# Patient Record
Sex: Male | Born: 1960 | Race: White | Hispanic: No | Marital: Married | Smoking: Current every day smoker
Health system: Southern US, Community
[De-identification: ages and names within clinical notes are randomized; demographics above are authoritative.]

## PROBLEM LIST (undated history)

## (undated) DIAGNOSIS — S92909A Unspecified fracture of unspecified foot, initial encounter for closed fracture: Secondary | ICD-10-CM

## (undated) DIAGNOSIS — C801 Malignant (primary) neoplasm, unspecified: Secondary | ICD-10-CM

## (undated) DIAGNOSIS — Z0000 Encounter for general adult medical examination without abnormal findings: Secondary | ICD-10-CM

## (undated) DIAGNOSIS — E871 Hypo-osmolality and hyponatremia: Secondary | ICD-10-CM

## (undated) DIAGNOSIS — M549 Dorsalgia, unspecified: Secondary | ICD-10-CM

## (undated) DIAGNOSIS — E782 Mixed hyperlipidemia: Secondary | ICD-10-CM

## (undated) DIAGNOSIS — R945 Abnormal results of liver function studies: Secondary | ICD-10-CM

## (undated) HISTORY — DX: Hypo-osmolality and hyponatremia: E87.1

## (undated) HISTORY — DX: Dorsalgia, unspecified: M54.9

## (undated) HISTORY — PX: SKIN CANCER EXCISION: SHX779

## (undated) HISTORY — PX: HERNIA REPAIR: SHX51

## (undated) HISTORY — DX: Unspecified fracture of unspecified foot, initial encounter for closed fracture: S92.909A

## (undated) HISTORY — DX: Mixed hyperlipidemia: E78.2

## (undated) HISTORY — DX: Abnormal results of liver function studies: R94.5

## (undated) HISTORY — DX: Encounter for general adult medical examination without abnormal findings: Z00.00

## (undated) HISTORY — DX: Malignant (primary) neoplasm, unspecified: C80.1

---

## 2014-11-21 ENCOUNTER — Telehealth: Payer: Self-pay

## 2014-11-21 NOTE — Telephone Encounter (Signed)
New patient Scheduled to see Elyn Aquas, PA-C on 11/23/14 @ 1:30 pm.   Left a message for call back.

## 2014-11-23 ENCOUNTER — Ambulatory Visit: Payer: Self-pay | Admitting: Family Medicine

## 2014-12-07 NOTE — Telephone Encounter (Signed)
Pt no showed for appointment

## 2015-02-09 ENCOUNTER — Encounter: Payer: Self-pay | Admitting: *Deleted

## 2015-02-09 ENCOUNTER — Telehealth: Payer: Self-pay | Admitting: *Deleted

## 2015-02-09 NOTE — Telephone Encounter (Signed)
Pre-Visit Call completed with patient and chart updated.   Pre-Visit Info documented in Specialty Comments under [employer].    

## 2015-02-12 ENCOUNTER — Ambulatory Visit (HOSPITAL_BASED_OUTPATIENT_CLINIC_OR_DEPARTMENT_OTHER)
Admission: RE | Admit: 2015-02-12 | Discharge: 2015-02-12 | Disposition: A | Discharge status: Home / Self Care | Payer: 59 | Source: Ambulatory Visit | Attending: Family Medicine | Admitting: Family Medicine

## 2015-02-12 ENCOUNTER — Ambulatory Visit (INDEPENDENT_AMBULATORY_CARE_PROVIDER_SITE_OTHER): Payer: 59 | Admitting: Family Medicine

## 2015-02-12 ENCOUNTER — Encounter: Payer: Self-pay | Admitting: Family Medicine

## 2015-02-12 VITALS — BP 125/82 | HR 99 | Temp 97.8°F | Ht 71.0 in | Wt 226.1 lb

## 2015-02-12 DIAGNOSIS — E782 Mixed hyperlipidemia: Secondary | ICD-10-CM

## 2015-02-12 DIAGNOSIS — M545 Low back pain: Secondary | ICD-10-CM

## 2015-02-12 DIAGNOSIS — R945 Abnormal results of liver function studies: Secondary | ICD-10-CM

## 2015-02-12 DIAGNOSIS — M479 Spondylosis, unspecified: Secondary | ICD-10-CM | POA: Diagnosis not present

## 2015-02-12 DIAGNOSIS — Z0000 Encounter for general adult medical examination without abnormal findings: Secondary | ICD-10-CM

## 2015-02-12 DIAGNOSIS — E871 Hypo-osmolality and hyponatremia: Secondary | ICD-10-CM

## 2015-02-12 DIAGNOSIS — K7689 Other specified diseases of liver: Secondary | ICD-10-CM

## 2015-02-12 DIAGNOSIS — F418 Other specified anxiety disorders: Secondary | ICD-10-CM

## 2015-02-12 LAB — CBC
HEMATOCRIT: 49.1 % (ref 39.0–52.0)
Hemoglobin: 16.6 g/dL (ref 13.0–17.0)
MCHC: 33.9 g/dL (ref 30.0–36.0)
MCV: 89.3 fl (ref 78.0–100.0)
Platelets: 249 10*3/uL (ref 150.0–400.0)
RBC: 5.5 Mil/uL (ref 4.22–5.81)
RDW: 13.3 % (ref 11.5–15.5)
WBC: 14 10*3/uL — ABNORMAL HIGH (ref 4.0–10.5)

## 2015-02-12 LAB — COMPREHENSIVE METABOLIC PANEL
ALT: 106 U/L — AB (ref 0–53)
AST: 62 U/L — ABNORMAL HIGH (ref 0–37)
Albumin: 4.5 g/dL (ref 3.5–5.2)
Alkaline Phosphatase: 77 U/L (ref 39–117)
BILIRUBIN TOTAL: 0.6 mg/dL (ref 0.2–1.2)
BUN: 16 mg/dL (ref 6–23)
CHLORIDE: 101 meq/L (ref 96–112)
CO2: 25 meq/L (ref 19–32)
Calcium: 9.8 mg/dL (ref 8.4–10.5)
Creatinine, Ser: 0.84 mg/dL (ref 0.40–1.50)
GFR: 101.35 mL/min (ref >60.00)
Glucose, Bld: 101 mg/dL — ABNORMAL HIGH (ref 70–99)
POTASSIUM: 4.2 meq/L (ref 3.5–5.1)
Sodium: 134 mEq/L — ABNORMAL LOW (ref 135–145)
Total Protein: 8.1 g/dL (ref 6.0–8.3)

## 2015-02-12 LAB — LIPID PANEL
Cholesterol: 212 mg/dL — ABNORMAL HIGH (ref 0–200)
HDL: 32.3 mg/dL — ABNORMAL LOW (ref >39.00)
LDL CALC: 140 mg/dL — AB (ref 0–99)
NONHDL: 179.7
TRIGLYCERIDES: 197 mg/dL — AB (ref 0.0–149.0)
Total CHOL/HDL Ratio: 7
VLDL: 39.4 mg/dL (ref 0.0–40.0)

## 2015-02-12 LAB — TSH: TSH: 4.03 u[IU]/mL (ref 0.35–4.50)

## 2015-02-12 MED ORDER — HYDROCODONE-ACETAMINOPHEN 10-325 MG PO TABS: 1.0000 | ORAL_TABLET | Freq: Every day | ORAL | Status: DC | PRN

## 2015-02-12 MED ORDER — ESCITALOPRAM OXALATE 10 MG PO TABS: 10.0000 mg | ORAL_TABLET | Freq: Every day | ORAL | Status: DC

## 2015-02-12 MED ORDER — ALPRAZOLAM 0.25 MG PO TABS: 0.2500 mg | ORAL_TABLET | Freq: Two times a day (BID) | ORAL | Status: DC | PRN

## 2015-02-12 MED ORDER — METHOCARBAMOL 500 MG PO TABS: 500.0000 mg | ORAL_TABLET | Freq: Two times a day (BID) | ORAL | Status: DC

## 2015-02-12 MED ORDER — PREDNISONE 20 MG PO TABS: 20.0000 mg | ORAL_TABLET | Freq: Two times a day (BID) | ORAL | Status: DC

## 2015-02-12 NOTE — Patient Instructions (Addendum)
[facility], Dr Cherly Beach? Previous PMD, off of [place] [facility] [place] [facility]  [facility], Dr Gershon Mussel, podiatry on [address] in [place] Encouraged moist heat and gentle stretching as tolerated. May try [place] patches twice daily and prescription meds as directed and report if symptoms worsen or seek immediate care   Preventive Care for Adults A healthy lifestyle and preventive care can promote health and wellness. Preventive health guidelines for men include the following key practices:  A routine yearly physical is a good way to check with your health care provider about your health and preventative screening. It is a chance to share any concerns and updates on your health and to receive a thorough exam.  Visit your dentist for a routine exam and preventative care every 6 months. Brush your teeth twice a day and floss once a day. Good oral hygiene prevents tooth decay and gum disease.  The frequency of eye exams is based on your age, health, family medical history, use of contact lenses, and other factors. Follow your health care provider's recommendations for frequency of eye exams.  Eat a healthy diet. Foods such as vegetables, fruits, whole grains, low-fat dairy products, and lean protein foods contain the nutrients you need without too many calories. Decrease your intake of foods high in solid fats, added sugars, and salt. Eat the right amount of calories for you.Get information about a proper diet from your health care provider, if necessary.  Regular physical exercise is one of the most important things you can do for your health. Most adults should get at least 150 minutes of moderate-intensity exercise (any activity that increases your heart rate and causes you to sweat) each week. In addition, most adults need muscle-strengthening exercises on 2 or more days a week.  Maintain a healthy weight. The body mass index (BMI) is a screening tool to identify possible weight  problems. It provides an estimate of body fat based on height and weight. Your health care provider can find your BMI and can help you achieve or maintain a healthy weight.For adults 20 years and older:  A BMI below 18.5 is considered underweight.  A BMI of 18.5 to 24.9 is normal.  A BMI of 25 to 29.9 is considered overweight.  A BMI of 30 and above is considered obese.  Maintain normal blood lipids and cholesterol levels by exercising and minimizing your intake of saturated fat. Eat a balanced diet with plenty of fruit and vegetables. Blood tests for lipids and cholesterol should begin at age 34 and be repeated every 5 years. If your lipid or cholesterol levels are high, you are over 50, or you are at high risk for heart disease, you may need your cholesterol levels checked more frequently.Ongoing high lipid and cholesterol levels should be treated with medicines if diet and exercise are not working.  If you smoke, find out from your health care provider how to quit. If you do not use tobacco, do not start.  Lung cancer screening is recommended for adults aged 32-80 years who are at high risk for developing lung cancer because of a history of smoking. A yearly low-dose CT scan of the lungs is recommended for people who have at least a 30-pack-year history of smoking and are a current smoker or have quit within the past 15 years. A pack year of smoking is smoking an average of 1 pack of cigarettes a day for 1 year (for example: 1 pack a day  for 30 years or 2 packs a day for 15 years). Yearly screening should continue until the smoker has stopped smoking for at least 15 years. Yearly screening should be stopped for people who develop a health problem that would prevent them from having lung cancer treatment.  If you choose to drink alcohol, do not have more than 2 drinks per day. One drink is considered to be 12 ounces (355 mL) of beer, 5 ounces (148 mL) of wine, or 1.5 ounces (44 mL) of  liquor.  Avoid use of street drugs. Do not share needles with anyone. Ask for help if you need support or instructions about stopping the use of drugs.  High blood pressure causes heart disease and increases the risk of stroke. Your blood pressure should be checked at least every 1-2 years. Ongoing high blood pressure should be treated with medicines, if weight loss and exercise are not effective.  If you are 68-33 years old, ask your health care provider if you should take aspirin to prevent heart disease.  Diabetes screening involves taking a blood sample to check your fasting blood sugar level. This should be done once every 3 years, after age 23, if you are within normal weight and without risk factors for diabetes. Testing should be considered at a younger age or be carried out more frequently if you are overweight and have at least 1 risk factor for diabetes.  Colorectal cancer can be detected and often prevented. Most routine colorectal cancer screening begins at the age of 51 and continues through age 13. However, your health care provider may recommend screening at an earlier age if you have risk factors for colon cancer. On a yearly basis, your health care provider may provide home test kits to check for hidden blood in the stool. Use of a small camera at the end of a tube to directly examine the colon (sigmoidoscopy or colonoscopy) can detect the earliest forms of colorectal cancer. Talk to your health care provider about this at age 77, when routine screening begins. Direct exam of the colon should be repeated every 5-10 years through age 45, unless early forms of precancerous polyps or small growths are found.  People who are at an increased risk for hepatitis B should be screened for this virus. You are considered at high risk for hepatitis B if:  You were born in a country where hepatitis B occurs often. Talk with your health care provider about which countries are considered high  risk.  Your parents were born in a high-risk country and you have not received a shot to protect against hepatitis B (hepatitis B vaccine).  You have HIV or AIDS.  You use needles to inject street drugs.  You live with, or have sex with, someone who has hepatitis B.  You are a man who has sex with other men (MSM).  You get hemodialysis treatment.  You take certain medicines for conditions such as cancer, organ transplantation, and autoimmune conditions.  Hepatitis C blood testing is recommended for all people born from [birth year] through 1965 and any individual with known risks for hepatitis C.  Practice safe sex. Use condoms and avoid high-risk sexual practices to reduce the spread of sexually transmitted infections (STIs). STIs include gonorrhea, chlamydia, syphilis, trichomonas, herpes, HPV, and human immunodeficiency virus (HIV). Herpes, HIV, and HPV are viral illnesses that have no cure. They can result in disability, cancer, and death.  If you are at risk of being infected with HIV, it  is recommended that you take a prescription medicine daily to prevent HIV infection. This is called preexposure prophylaxis (PrEP). You are considered at risk if:  You are a man who has sex with other men (MSM) and have other risk factors.  You are a heterosexual man, are sexually active, and are at increased risk for HIV infection.  You take drugs by injection.  You are sexually active with a partner who has HIV.  Talk with your health care provider about whether you are at high risk of being infected with HIV. If you choose to begin PrEP, you should first be tested for HIV. You should then be tested every 3 months for as long as you are taking PrEP.  A one-time screening for abdominal aortic aneurysm (AAA) and surgical repair of large AAAs by ultrasound are recommended for men ages 65 to 70 years who are current or former smokers.  Healthy men should no longer receive prostate-specific antigen (PSA)  blood tests as part of routine cancer screening. Talk with your health care provider about prostate cancer screening.  Testicular cancer screening is not recommended for adult males who have no symptoms. Screening includes self-exam, a health care provider exam, and other screening tests. Consult with your health care provider about any symptoms you have or any concerns you have about testicular cancer.  Use sunscreen. Apply sunscreen liberally and repeatedly throughout the day. You should seek shade when your shadow is shorter than you. Protect yourself by wearing long sleeves, pants, a wide-brimmed hat, and sunglasses year round, whenever you are outdoors.  Once a month, do a whole-body skin exam, using a mirror to look at the skin on your back. Tell your health care provider about new moles, moles that have irregular borders, moles that are larger than a pencil eraser, or moles that have changed in shape or color.  Stay current with required vaccines (immunizations).  Influenza vaccine. All adults should be immunized every year.  Tetanus, diphtheria, and acellular pertussis (Td, Tdap) vaccine. An adult who has not previously received Tdap or who does not know his vaccine status should receive 1 dose of Tdap. This initial dose should be followed by tetanus and diphtheria toxoids (Td) booster doses every 10 years. Adults with an unknown or incomplete history of completing a 3-dose immunization series with Td-containing vaccines should begin or complete a primary immunization series including a Tdap dose. Adults should receive a Td booster every 10 years.  Varicella vaccine. An adult without evidence of immunity to varicella should receive 2 doses or a second dose if he has previously received 1 dose.  Human papillomavirus (HPV) vaccine. Males aged 55-21 years who have not received the vaccine previously should receive the 3-dose series. Males aged 22-26 years may be immunized. Immunization is  recommended through the age of 42 years for any male who has sex with males and did not get any or all doses earlier. Immunization is recommended for any person with an immunocompromised condition through the age of 68 years if he did not get any or all doses earlier. During the 3-dose series, the second dose should be obtained 4-8 weeks after the first dose. The third dose should be obtained 24 weeks after the first dose and 16 weeks after the second dose.  Zoster vaccine. One dose is recommended for adults aged 24 years or older unless certain conditions are present.  Measles, mumps, and rubella (MMR) vaccine. Adults born before [birth year] generally are considered immune to measles  and mumps. Adults born in [birth year] or later should have 1 or more doses of MMR vaccine unless there is a contraindication to the vaccine or there is laboratory evidence of immunity to each of the three diseases. A routine second dose of MMR vaccine should be obtained at least 28 days after the first dose for students attending postsecondary schools, health care workers, or international travelers. People who received inactivated measles vaccine or an unknown type of measles vaccine during 1963-1967 should receive 2 doses of MMR vaccine. People who received inactivated mumps vaccine or an unknown type of mumps vaccine before 1979 and are at high risk for mumps infection should consider immunization with 2 doses of MMR vaccine. Unvaccinated health care workers born before [birth year] who lack laboratory evidence of measles, mumps, or rubella immunity or laboratory confirmation of disease should consider measles and mumps immunization with 2 doses of MMR vaccine or rubella immunization with 1 dose of MMR vaccine.  Pneumococcal 13-valent conjugate (PCV13) vaccine. When indicated, a person who is uncertain of his immunization history and has no record of immunization should receive the PCV13 vaccine. An adult aged 79 years or older who has certain  medical conditions and has not been previously immunized should receive 1 dose of PCV13 vaccine. This PCV13 should be followed with a dose of pneumococcal polysaccharide (PPSV23) vaccine. The PPSV23 vaccine dose should be obtained at least 8 weeks after the dose of PCV13 vaccine. An adult aged 62 years or older who has certain medical conditions and previously received 1 or more doses of PPSV23 vaccine should receive 1 dose of PCV13. The PCV13 vaccine dose should be obtained 1 or more years after the last PPSV23 vaccine dose.  Pneumococcal polysaccharide (PPSV23) vaccine. When PCV13 is also indicated, PCV13 should be obtained first. All adults aged 74 years and older should be immunized. An adult younger than age 24 years who has certain medical conditions should be immunized. Any person who resides in a nursing home or long-term care facility should be immunized. An adult smoker should be immunized. People with an immunocompromised condition and certain other conditions should receive both PCV13 and PPSV23 vaccines. People with human immunodeficiency virus (HIV) infection should be immunized as soon as possible after diagnosis. Immunization during chemotherapy or radiation therapy should be avoided. Routine use of PPSV23 vaccine is not recommended for American Indians, [place] Natives, or people younger than 65 years unless there are medical conditions that require PPSV23 vaccine. When indicated, people who have unknown immunization and have no record of immunization should receive PPSV23 vaccine. One-time revaccination 5 years after the first dose of PPSV23 is recommended for people aged 19-64 years who have chronic kidney failure, nephrotic syndrome, asplenia, or immunocompromised conditions. People who received 1-2 doses of PPSV23 before age 66 years should receive another dose of PPSV23 vaccine at age 9 years or later if at least 5 years have passed since the previous dose. Doses of PPSV23 are not needed for  people immunized with PPSV23 at or after age 66 years.  Meningococcal vaccine. Adults with asplenia or persistent complement component deficiencies should receive 2 doses of quadrivalent meningococcal conjugate (MenACWY-D) vaccine. The doses should be obtained at least 2 months apart. Microbiologists working with certain meningococcal bacteria, [place] recruits, people at risk during an outbreak, and people who travel to or live in countries with a high rate of meningitis should be immunized. A first-year college student up through age 38 years who is living in a residence hall  should receive a dose if he did not receive a dose on or after his 16th birthday. Adults who have certain high-risk conditions should receive one or more doses of vaccine.  Hepatitis A vaccine. Adults who wish to be protected from this disease, have certain high-risk conditions, work with hepatitis A-infected animals, work in hepatitis A research labs, or travel to or work in countries with a high rate of hepatitis A should be immunized. Adults who were previously unvaccinated and who anticipate close contact with an international adoptee during the first 60 days after arrival in the [place] from a country with a high rate of hepatitis A should be immunized.  Hepatitis B vaccine. Adults should be immunized if they wish to be protected from this disease, have certain high-risk conditions, may be exposed to blood or other infectious body fluids, are household contacts or sex partners of hepatitis B positive people, are clients or workers in certain care facilities, or travel to or work in countries with a high rate of hepatitis B.  Haemophilus influenzae type b (Hib) vaccine. A previously unvaccinated person with asplenia or sickle cell disease or having a scheduled splenectomy should receive 1 dose of Hib vaccine. Regardless of previous immunization, a recipient of a hematopoietic stem cell transplant should receive a 3-dose  series 6-12 months after his successful transplant. Hib vaccine is not recommended for adults with HIV infection. Preventive Service / Frequency Ages 81 to 87  Blood pressure check.** / Every 1 to 2 years.  Lipid and cholesterol check.** / Every 5 years beginning at age 73.  Hepatitis C blood test.** / For any individual with known risks for hepatitis C.  Skin self-exam. / Monthly.  Influenza vaccine. / Every year.  Tetanus, diphtheria, and acellular pertussis (Tdap, Td) vaccine.** / Consult your health care provider. 1 dose of Td every 10 years.  Varicella vaccine.** / Consult your health care provider.  HPV vaccine. / 3 doses over 6 months, if 67 or younger.  Measles, mumps, rubella (MMR) vaccine.** / You need at least 1 dose of MMR if you were born in 1957 or later. You may also need a second dose.  Pneumococcal 13-valent conjugate (PCV13) vaccine.** / Consult your health care provider.  Pneumococcal polysaccharide (PPSV23) vaccine.** / 1 to 2 doses if you smoke cigarettes or if you have certain conditions.  Meningococcal vaccine.** / 1 dose if you are age 7 to 105 years and a Market researcher living in a residence hall, or have one of several medical conditions. You may also need additional booster doses.  Hepatitis A vaccine.** / Consult your health care provider.  Hepatitis B vaccine.** / Consult your health care provider.  Haemophilus influenzae type b (Hib) vaccine.** / Consult your health care provider. Ages 25 to 44  Blood pressure check.** / Every 1 to 2 years.  Lipid and cholesterol check.** / Every 5 years beginning at age 74.  Lung cancer screening. / Every year if you are aged 37-80 years and have a 30-pack-year history of smoking and currently smoke or have quit within the past 15 years. Yearly screening is stopped once you have quit smoking for at least 15 years or develop a health problem that would prevent you from having lung cancer  treatment.  Fecal occult blood test (FOBT) of stool. / Every year beginning at age 71 and continuing until age 65. You may not have to do this test if you get a colonoscopy every 10 years.  Flexible  sigmoidoscopy** or colonoscopy.** / Every 5 years for a flexible sigmoidoscopy or every 10 years for a colonoscopy beginning at age 64 and continuing until age 62.  Hepatitis C blood test.** / For all people born from [birth year] through 1965 and any individual with known risks for hepatitis C.  Skin self-exam. / Monthly.  Influenza vaccine. / Every year.  Tetanus, diphtheria, and acellular pertussis (Tdap/Td) vaccine.** / Consult your health care provider. 1 dose of Td every 10 years.  Varicella vaccine.** / Consult your health care provider.  Zoster vaccine.** / 1 dose for adults aged 55 years or older.  Measles, mumps, rubella (MMR) vaccine.** / You need at least 1 dose of MMR if you were born in 1957 or later. You may also need a second dose.  Pneumococcal 13-valent conjugate (PCV13) vaccine.** / Consult your health care provider.  Pneumococcal polysaccharide (PPSV23) vaccine.** / 1 to 2 doses if you smoke cigarettes or if you have certain conditions.  Meningococcal vaccine.** / Consult your health care provider.  Hepatitis A vaccine.** / Consult your health care provider.  Hepatitis B vaccine.** / Consult your health care provider.  Haemophilus influenzae type b (Hib) vaccine.** / Consult your health care provider. Ages 73 and over  Blood pressure check.** / Every 1 to 2 years.  Lipid and cholesterol check.**/ Every 5 years beginning at age 43.  Lung cancer screening. / Every year if you are aged 97-80 years and have a 30-pack-year history of smoking and currently smoke or have quit within the past 15 years. Yearly screening is stopped once you have quit smoking for at least 15 years or develop a health problem that would prevent you from having lung cancer treatment.  Fecal occult  blood test (FOBT) of stool. / Every year beginning at age 56 and continuing until age 68. You may not have to do this test if you get a colonoscopy every 10 years.  Flexible sigmoidoscopy** or colonoscopy.** / Every 5 years for a flexible sigmoidoscopy or every 10 years for a colonoscopy beginning at age 46 and continuing until age 53.  Hepatitis C blood test.** / For all people born from [birth year] through 1965 and any individual with known risks for hepatitis C.  Abdominal aortic aneurysm (AAA) screening.** / A one-time screening for ages 62 to 97 years who are current or former smokers.  Skin self-exam. / Monthly.  Influenza vaccine. / Every year.  Tetanus, diphtheria, and acellular pertussis (Tdap/Td) vaccine.** / 1 dose of Td every 10 years.  Varicella vaccine.** / Consult your health care provider.  Zoster vaccine.** / 1 dose for adults aged 41 years or older.  Pneumococcal 13-valent conjugate (PCV13) vaccine.** / Consult your health care provider.  Pneumococcal polysaccharide (PPSV23) vaccine.** / 1 dose for all adults aged 8 years and older.  Meningococcal vaccine.** / Consult your health care provider.  Hepatitis A vaccine.** / Consult your health care provider.  Hepatitis B vaccine.** / Consult your health care provider.  Haemophilus influenzae type b (Hib) vaccine.** / Consult your health care provider. **Family history and personal history of risk and conditions may change your health care provider's recommendations. Document Released: 11/04/2001 Document Revised: 09/13/2013 Document Reviewed: 02/03/2011 [facility] Patient Information 2015 [place], [place]. This information is not intended to replace advice given to you by your health care provider. Make sure you discuss any questions you have with your health care provider. Nicotine Addiction Nicotine can act as both a stimulant (excites/activates) and a sedative (calms/quiets). Immediately after exposure  to nicotine, there is a  "kick" caused in part by the drug's stimulation of the adrenal glands and resulting discharge of adrenaline (epinephrine). The rush of adrenaline stimulates the body and causes a sudden release of sugar. This means that smokers are always slightly hyperglycemic. Hyperglycemic means that the blood sugar is high, just like in diabetics. Nicotine also decreases the amount of insulin which helps control sugar levels in the body. There is an increase in blood pressure, breathing, and the rate of heart beats.  In addition, nicotine indirectly causes a release of dopamine in the brain that controls pleasure and motivation. A similar reaction is seen with other drugs of abuse, such as cocaine and heroin. This dopamine release is thought to cause the pleasurable sensations when smoking. In some different cases, nicotine can also create a calming effect, depending on sensitivity of the smoker's nervous system and the dose of nicotine taken. WHAT HAPPENS WHEN NICOTINE IS TAKEN FOR LONG PERIODS OF TIME?  Long-term use of nicotine results in addiction. It is difficult to stop.  Repeated use of nicotine creates tolerance. Higher doses of nicotine are needed to get the "kick." When nicotine use is stopped, withdrawal may last a month or more. Withdrawal may begin within a few hours after the last cigarette. Symptoms peak within the first few days and may lessen within a few weeks. For some people, however, symptoms may last for months or longer. Withdrawal symptoms include:   Irritability.  Craving.  Learning and attention deficits.  Sleep disturbances.  Increased appetite. Craving for tobacco may last for 6 months or longer. Many behaviors done while using nicotine can also play a part in the severity of withdrawal symptoms. For some people, the feel, smell, and sight of a cigarette and the ritual of obtaining, handling, lighting, and smoking the cigarette are closely linked with the pleasure of smoking. When  stopped, they also miss the related behaviors which make the withdrawal or craving worse. While nicotine gum and patches may lessen the drug aspects of withdrawal, cravings often persist. WHAT ARE THE MEDICAL CONSEQUENCES OF NICOTINE USE?  Nicotine addiction accounts for one-third of all cancers. The top cancer caused by tobacco is lung cancer. Lung cancer is the number one cancer killer of both men and women.  Smoking is also associated with cancers of the:  Mouth.  Pharynx.  Larynx.  Esophagus.  Stomach.  Pancreas.  Cervix.  Kidney.  Ureter.  Bladder.  Smoking also causes lung diseases such as lasting (chronic) bronchitis and emphysema.  It worsens asthma in adults and children.  Smoking increases the risk of heart disease, including:  Stroke.  Heart attack.  Vascular disease.  Aneurysm.  Passive or secondary smoke can also increase medical risks including:  Asthma in children.  Sudden Infant Death Syndrome (SIDS).  Additionally, dropped cigarettes are the leading cause of residential fire fatalities.  Nicotine poisoning has been reported from accidental ingestion of tobacco products by children and pets. Death usually results in a few minutes from respiratory failure (when a person stops breathing) caused by paralysis. TREATMENT   Medication. Nicotine replacement medicines such as nicotine gum and the patch are used to stop smoking. These medicines gradually lower the dosage of nicotine in the body. These medicines do not contain the carbon monoxide and other toxins found in tobacco smoke.  Hypnotherapy.  Relaxation therapy.  Nicotine Anonymous (a 12-step support program). Find times and locations in your local yellow pages. Document Released: 05/14/2004 Document Revised: 12/01/2011  Document Reviewed: 11/04/2013 [facility] Patient Information 2015 [place], [place]. This information is not intended to replace advice given to you by your health care provider.  Make sure you discuss any questions you have with your health care provider.

## 2015-02-12 NOTE — Progress Notes (Signed)
Pre visit review using our clinic review tool, if applicable. No additional management support is needed unless otherwise documented below in the visit note. 

## 2015-02-13 ENCOUNTER — Other Ambulatory Visit: Payer: Self-pay | Admitting: Family Medicine

## 2015-02-13 ENCOUNTER — Telehealth: Payer: Self-pay | Admitting: Family Medicine

## 2015-02-13 DIAGNOSIS — D72829 Elevated white blood cell count, unspecified: Secondary | ICD-10-CM

## 2015-02-13 DIAGNOSIS — R7989 Other specified abnormal findings of blood chemistry: Secondary | ICD-10-CM

## 2015-02-13 DIAGNOSIS — R945 Abnormal results of liver function studies: Secondary | ICD-10-CM

## 2015-02-13 NOTE — Telephone Encounter (Signed)
Labs entered for repeat on March 02, 2015

## 2015-02-13 NOTE — Telephone Encounter (Signed)
Patients wife informed mailed a copy of labs to home

## 2015-02-13 NOTE — Telephone Encounter (Signed)
Caller name: stacey Relation to pt: wife Call back number: [phone] Pharmacy:  Reason for call:   Requesting liver results from this past time and last year. Wanted to compare.

## 2015-02-19 ENCOUNTER — Encounter: Payer: Self-pay | Admitting: Family Medicine

## 2015-02-19 DIAGNOSIS — F418 Other specified anxiety disorders: Secondary | ICD-10-CM | POA: Insufficient documentation

## 2015-02-19 DIAGNOSIS — E871 Hypo-osmolality and hyponatremia: Secondary | ICD-10-CM

## 2015-02-19 DIAGNOSIS — Z0000 Encounter for general adult medical examination without abnormal findings: Secondary | ICD-10-CM | POA: Insufficient documentation

## 2015-02-19 DIAGNOSIS — R945 Abnormal results of liver function studies: Secondary | ICD-10-CM

## 2015-02-19 DIAGNOSIS — E782 Mixed hyperlipidemia: Secondary | ICD-10-CM

## 2015-02-19 DIAGNOSIS — M549 Dorsalgia, unspecified: Secondary | ICD-10-CM

## 2015-02-19 HISTORY — DX: Abnormal results of liver function studies: R94.5

## 2015-02-19 HISTORY — DX: Encounter for general adult medical examination without abnormal findings: Z00.00

## 2015-02-19 HISTORY — DX: Mixed hyperlipidemia: E78.2

## 2015-02-19 HISTORY — DX: Dorsalgia, unspecified: M54.9

## 2015-02-19 HISTORY — DX: Hypo-osmolality and hyponatremia: E87.1

## 2015-02-19 NOTE — Assessment & Plan Note (Signed)
Referred for screening colonoscopy 

## 2015-02-19 NOTE — Assessment & Plan Note (Signed)
Started on Lexapro, Alprazolam prn.

## 2015-02-19 NOTE — Assessment & Plan Note (Signed)
Encouraged heart healthy diet, increase exercise, avoid trans fats, consider a krill oil cap daily 

## 2015-02-19 NOTE — Progress Notes (Signed)
Jesus Clark  [id number] 11/07/1960 02/19/2015      Progress Note-Follow Up  Subjective  Chief Complaint  Chief Complaint  Patient presents with  . Establish Care    HPI  Patient is a 54 y.o. male in today for routine medical care. Patient is in today for new patient appointment. His greatest complaint is of back pain and stress. He is having increased pain in his low back today but has been struggling for quite some time. Says the pain is bad enough to limit his activity and he is interested in having it looked at. He also notes increases in stress and thus depression and anxiety this year largely job-related. No other recent illness or acute complaints. Denies CP/palp/SOB/HA/congestion/fevers/GI or GU c/o. Taking meds as prescribed  Past Medical History  Diagnosis Date  . Cancer     skin  . Foot fracture right  . Back pain 02/19/2015  . Preventative health care 02/19/2015  . Hyperlipidemia, mixed 02/19/2015  . Abnormal liver function 02/19/2015  . Hyponatremia 02/19/2015    Past Surgical History  Procedure Laterality Date  . Skin cancer excision Left   . Hernia repair      umbilical    Family History  Problem Relation Age of Onset  . Cancer Mother 46    lung  . Myasthenia gravis Mother 22  . Rheum arthritis Sister   . Osteoporosis Sister   . Heart disease Paternal Grandfather     History   Social History  . Marital Status: Married    Spouse Name: N/A  . Number of Children: N/A  . Years of Education: N/A   Occupational History  . Engineer    Social History Main Topics  . Smoking status: Current Every Day Smoker -- 1.00 packs/day    Types: Cigarettes  . Smokeless tobacco: Not on file  . Alcohol Use: No  . Drug Use: No  . Sexual Activity: Yes     Comment: lives with wife. no dietary restrictions.  off work due to broken fot   Other Topics Concern  . Not on file   Social History Narrative    Current Outpatient Prescriptions on File Prior to Visit   Medication Sig Dispense Refill  . multivitamin-iron-minerals-folic acid (CENTRUM) chewable tablet Chew 1 tablet by mouth daily.     No current facility-administered medications on file prior to visit.    Allergies  Allergen Reactions  . Codeine Itching    Caused migraine    Review of Systems  Review of Systems  Constitutional: Negative for fever, chills and malaise/fatigue.  HENT: Negative for congestion, hearing loss and nosebleeds.   Eyes: Negative for discharge.  Respiratory: Negative for cough, sputum production, shortness of breath and wheezing.   Cardiovascular: Negative for chest pain, palpitations and leg swelling.  Gastrointestinal: Negative for heartburn, nausea, vomiting, abdominal pain, diarrhea, constipation and blood in stool.  Genitourinary: Negative for dysuria, urgency, frequency and hematuria.  Musculoskeletal: Negative for myalgias and falls.  Skin: Negative for rash.  Neurological: Negative for dizziness, tremors, sensory change, focal weakness, loss of consciousness, weakness and headaches.  Endo/Heme/Allergies: Negative for polydipsia. Does not bruise/bleed easily.  Psychiatric/Behavioral: Positive for depression. Negative for suicidal ideas. The patient is nervous/anxious. The patient does not have insomnia.     Objective  BP 125/82 mmHg  Pulse 99  Temp(Src) 97.8 F (36.6 C) (Oral)  Ht 5\' 11"  (1.803 m)  Wt 226 lb 2 oz (102.57 kg)  BMI 31.55 kg/m2  SpO2  98%  Physical Exam  Physical Exam  Constitutional: He is oriented to person, place, and time and well-developed, well-nourished, and in no distress. No distress.  HENT:  Head: Normocephalic and atraumatic.  Mouth/Throat: No oropharyngeal exudate.  Eyes: Conjunctivae and EOM are normal. Right eye exhibits no discharge. Left eye exhibits no discharge.  Neck: Neck supple. No thyromegaly present.  Cardiovascular: Normal rate, regular rhythm and normal heart sounds.   No murmur  heard. Pulmonary/Chest: Effort normal and breath sounds normal. No respiratory distress.  Abdominal: Soft. Bowel sounds are normal. He exhibits no distension and no mass. There is no tenderness. There is no rebound and no guarding.  Musculoskeletal: He exhibits no edema or tenderness.  Neurological: He is alert and oriented to person, place, and time.  Skin: Skin is warm.  Psychiatric: Memory, affect and judgment normal.    Lab Results  Component Value Date   TSH 4.03 02/12/2015   Lab Results  Component Value Date   WBC 14.0* 02/12/2015   HGB 16.6 02/12/2015   HCT 49.1 02/12/2015   MCV 89.3 02/12/2015   PLT 249.0 02/12/2015   Lab Results  Component Value Date   CREATININE 0.84 02/12/2015   BUN 16 02/12/2015   NA 134* 02/12/2015   K 4.2 02/12/2015   CL 101 02/12/2015   CO2 25 02/12/2015   Lab Results  Component Value Date   ALT 106* 02/12/2015   AST 62* 02/12/2015   ALKPHOS 77 02/12/2015   BILITOT 0.6 02/12/2015   Lab Results  Component Value Date   CHOL 212* 02/12/2015   Lab Results  Component Value Date   HDL 32.30* 02/12/2015   Lab Results  Component Value Date   LDLCALC 140* 02/12/2015   Lab Results  Component Value Date   TRIG 197.0* 02/12/2015   Lab Results  Component Value Date   CHOLHDL 7 02/12/2015     Assessment & Plan  Hyponatremia Mild, asymptomatic, will monitor   Preventative health care Referred for screening colonoscopy   Hyperlipidemia, mixed Encouraged heart healthy diet, increase exercise, avoid trans fats, consider a krill oil cap daily   Depression with anxiety Started on Lexapro, Alprazolam prn.

## 2015-02-19 NOTE — Assessment & Plan Note (Signed)
Mild, asymptomatic, will monitor

## 2015-03-02 ENCOUNTER — Other Ambulatory Visit (INDEPENDENT_AMBULATORY_CARE_PROVIDER_SITE_OTHER): Payer: 59

## 2015-03-02 DIAGNOSIS — D72829 Elevated white blood cell count, unspecified: Secondary | ICD-10-CM | POA: Diagnosis not present

## 2015-03-02 DIAGNOSIS — R7989 Other specified abnormal findings of blood chemistry: Secondary | ICD-10-CM

## 2015-03-02 DIAGNOSIS — R945 Abnormal results of liver function studies: Secondary | ICD-10-CM

## 2015-03-02 LAB — CBC WITH DIFFERENTIAL/PLATELET
BASOS ABS: 0.1 10*3/uL (ref 0.0–0.1)
Basophils Relative: 0.6 % (ref 0.0–3.0)
EOS ABS: 0.4 10*3/uL (ref 0.0–0.7)
Eosinophils Relative: 3.4 % (ref 0.0–5.0)
HCT: 47.6 % (ref 39.0–52.0)
Hemoglobin: 16 g/dL (ref 13.0–17.0)
Lymphocytes Relative: 38.7 % (ref 12.0–46.0)
Lymphs Abs: 4.9 10*3/uL — ABNORMAL HIGH (ref 0.7–4.0)
MCHC: 33.6 g/dL (ref 30.0–36.0)
MCV: 89.5 fl (ref 78.0–100.0)
MONOS PCT: 5.4 % (ref 3.0–12.0)
Monocytes Absolute: 0.7 10*3/uL (ref 0.1–1.0)
NEUTROS ABS: 6.6 10*3/uL (ref 1.4–7.7)
Neutrophils Relative %: 51.9 % (ref 43.0–77.0)
Platelets: 229 10*3/uL (ref 150.0–400.0)
RBC: 5.32 Mil/uL (ref 4.22–5.81)
RDW: 13.4 % (ref 11.5–15.5)
WBC: 12.8 10*3/uL — ABNORMAL HIGH (ref 4.0–10.5)

## 2015-03-02 LAB — SEDIMENTATION RATE: Sed Rate: 6 mm/hr (ref 0–22)

## 2015-03-03 LAB — HEPATITIS PANEL, ACUTE
HCV AB: NEGATIVE
HEP A IGM: NONREACTIVE
Hep B C IgM: NONREACTIVE
Hepatitis B Surface Ag: NEGATIVE

## 2015-03-05 ENCOUNTER — Telehealth: Payer: Self-pay | Admitting: Family Medicine

## 2015-03-05 NOTE — Telephone Encounter (Signed)
Caller name:Lavone Hedeen Relationship to patient:self Can be reached:[phone] =  Reason for call: PT returning call RE: lab results from 03/02/15

## 2015-04-19 ENCOUNTER — Telehealth: Payer: Self-pay | Admitting: Family Medicine

## 2015-04-19 ENCOUNTER — Other Ambulatory Visit: Payer: Self-pay | Admitting: Family Medicine

## 2015-04-19 DIAGNOSIS — Z1211 Encounter for screening for malignant neoplasm of colon: Secondary | ICD-10-CM

## 2015-04-19 DIAGNOSIS — K921 Melena: Secondary | ICD-10-CM

## 2015-04-19 NOTE — Telephone Encounter (Signed)
Relation to pt: self  Call back number:[phone]  Reason for call:  Patient was inquiring about colonoscopy order didn't know if MD wanted him to have it before follow up appointment 05/07/15.

## 2015-04-19 NOTE — Telephone Encounter (Signed)
Noted.  Please see [facility] note.

## 2015-04-19 NOTE — Telephone Encounter (Signed)
Patient Name: Jesus Clark  DOB: [birth year]    Initial Comment Caller states husband has been having blood in stool for the last 3 days    Nurse Assessment  Nurse: Mallie Mussel, RN, Alveta Heimlich Date/Time Eilene Ghazi Time): 04/19/2015 10:59:17 AM  Confirm and document reason for call. If symptomatic, describe symptoms. ---Caller states that her husband has had blood in his stool for the past 3 days. A colonoscopy had scheduled previously, but had to cancel at the time due to health problems. He is weak. He is at work presently. She can text him to have him call us. Obviously he needs to be seen, but the triage would help me to figure out how soon he can be seen, so if she can do that, it would very helpful. She verbalized understanding.  Has the patient traveled out of the country within the last 30 days? ---Not Applicable  Does the patient require triage? ---No     Guidelines    Guideline Title Affirmed Question Affirmed Notes       Final Disposition User

## 2015-04-19 NOTE — Telephone Encounter (Signed)
FYI  Please see below

## 2015-04-19 NOTE — Telephone Encounter (Signed)
Caller name: Nachmen Mansel Relationship to patient: wife Can be reached: [phone]  Reason for call: Pt was here and saw Dr. Charlett Blake and referred to GI. Pt was barely able to walk and was not able to go to appt. The referral is closed and she is asking that we refer pt again. He is needing colonoscopy and having blood in stool. Transferred to [facility] today.

## 2015-04-19 NOTE — Telephone Encounter (Signed)
Patient's wife returned call and stated that patient has been bleeding a large amount for at least 3 days.  She states he is grey and feels terrible.  She was unable to complete the call and get recommendations from [facility] due to patient's phone losing service.    Notified wife to take patient to ED and she stated understanding and agreed.

## 2015-05-07 ENCOUNTER — Ambulatory Visit: Payer: 59 | Admitting: Family Medicine

## 2015-05-15 ENCOUNTER — Ambulatory Visit: Payer: Self-pay | Admitting: Family Medicine

## 2015-05-17 ENCOUNTER — Ambulatory Visit: Payer: 59 | Admitting: Family Medicine

## 2016-02-20 ENCOUNTER — Ambulatory Visit (INDEPENDENT_AMBULATORY_CARE_PROVIDER_SITE_OTHER): Payer: 59 | Admitting: Podiatry

## 2016-02-20 ENCOUNTER — Ambulatory Visit (INDEPENDENT_AMBULATORY_CARE_PROVIDER_SITE_OTHER): Payer: 59

## 2016-02-20 ENCOUNTER — Encounter: Payer: Self-pay | Admitting: Podiatry

## 2016-02-20 VITALS — BP 146/92 | HR 76 | Resp 20 | Ht 72.0 in | Wt 225.0 lb

## 2016-02-20 DIAGNOSIS — M79671 Pain in right foot: Secondary | ICD-10-CM

## 2016-02-20 DIAGNOSIS — M722 Plantar fascial fibromatosis: Secondary | ICD-10-CM | POA: Diagnosis not present

## 2016-02-20 MED ORDER — TRAMADOL HCL 50 MG PO TABS: 50.0000 mg | ORAL_TABLET | Freq: Three times a day (TID) | ORAL | Status: AC

## 2016-02-20 NOTE — Progress Notes (Signed)
Subjective:     Patient ID: Jesus Clark, male   DOB: Dec 02, 1960, 55 y.o.   MRN: [id number]  HPI patient presents stating I have had severe pain in my right heel of over one year duration and I had previous injections I've had immobilization with boot in the past reduced activity and the pain is intense. I am walking differently and I need it fixed   Review of Systems  All other systems reviewed and are negative.      Objective:   Physical Exam  Constitutional: He is oriented to person, place, and time.  Cardiovascular: Intact distal pulses.   Musculoskeletal: Normal range of motion.  Neurological: He is oriented to person, place, and time.  Skin: Skin is warm.  Nursing note and vitals reviewed.  neurovascular status found to be intact muscle strength adequate range of motion within normal limits with patient noted to have pain in the plantar aspect of the right heel at the insertional point of the tendon into the calcaneus with fluid buildup. Patient is noted to have good digital perfusion is well oriented 3 with moderate depression of the arch and no equinus condition     Assessment:     Acute plantar fasciitis right heel    Plan:     H&P and x-rays reviewed with patient. Today I went ahead and I advised on conservative surgical treatments and patient states she's tried everything conservatively refuses any more injections and wants it fixed. I have recommended a endoscopic release of the medial fascial band right and patient wants surgery and today I went ahead and I allowed him to read consent form reviewing alternative treatments complications and the fact recovery can take 6 months to one year with no long-term guarantees and I went ahead today and I dispensed air fracture walker and gave him strong encouragement to call us with any questions prior to procedure. Patient is scheduled for surgery in the next several weeks

## 2016-02-20 NOTE — Patient Instructions (Signed)

## 2016-02-20 NOTE — Progress Notes (Signed)
   Subjective:    Patient ID: Jesus Clark, male    DOB: 09/01/1961, 55 y.o.   MRN: [id number]  HPI  "I was referred here by Dr. Gershon Mussel.  I have pain on the bottom of my right heel up the back of my heel.  I get numbness around the edges.  I walk constantly on my job, up and down stairs.  I had a cortisone shot.  I had 2 hairline fractures in this foot within the last year.  Dr. Gershon Mussel said I have a bone spur.  I was in a boot for 6 weeks."    Review of Systems  Musculoskeletal: Positive for back pain and joint swelling.  All other systems reviewed and are negative.      Objective:   Physical Exam        Assessment & Plan:

## 2016-02-21 ENCOUNTER — Ambulatory Visit: Payer: 59 | Admitting: Podiatry

## 2016-03-11 ENCOUNTER — Encounter: Payer: Self-pay | Admitting: Podiatry

## 2016-03-11 DIAGNOSIS — M722 Plantar fascial fibromatosis: Secondary | ICD-10-CM | POA: Diagnosis not present

## 2016-03-12 ENCOUNTER — Telehealth: Payer: Self-pay | Admitting: *Deleted

## 2016-03-12 MED ORDER — OXYCODONE-ACETAMINOPHEN 5-325 MG PO TABS: ORAL_TABLET | ORAL | Status: AC

## 2016-03-12 NOTE — Telephone Encounter (Addendum)
Pt's wife, Erline Levine states pt has not slept since surgery,has a severe headache from the Hydrocodone. Erline Levine states pt is having heel pain, throbbing per his description.  Erline Levine states pt has taken Oxycodone before without problems.  I instructed Erline Levine to remove pt's cam walker, open-ended sock, and ace wrap, elevate and ice foot for 15 mins then place foot level with hip rewrap ace looser, apply sock and boot, but if worsens with elevation dangle for 15 mins then place level and rewrap looser, otherwise no dangling. I told pt I would advise Dr.Regal of the pain and headache and call with further instructions. Dr. Paulla Dolly states stop Hydrocodone and order Percocet 5/325mg  #30 one tablet every 4-6 hours prn foot pain. Orders to [facility] and instructed pt to pick up in the [place] office.  Erline Levine states pt had immediate relief when removed the ace and elevated.

## 2016-03-20 ENCOUNTER — Encounter: Payer: Self-pay | Admitting: Podiatry

## 2016-03-20 ENCOUNTER — Ambulatory Visit (INDEPENDENT_AMBULATORY_CARE_PROVIDER_SITE_OTHER): Payer: 59 | Admitting: Podiatry

## 2016-03-20 VITALS — BP 128/86 | HR 88 | Resp 12

## 2016-03-20 DIAGNOSIS — Z9889 Other specified postprocedural states: Secondary | ICD-10-CM | POA: Diagnosis not present

## 2016-03-20 DIAGNOSIS — M722 Plantar fascial fibromatosis: Secondary | ICD-10-CM

## 2016-03-20 NOTE — Progress Notes (Signed)
Subjective:     Patient ID: Jesus Clark, male   DOB: October 13, 1960, 55 y.o.   MRN: [id number]  HPI patient presents stating I'm doing good but I did have quite a bit of pain   Review of Systems     Objective:   Physical Exam Neurovascular status intact muscle strength adequate with discomfort right plantar heel after surgery that's normal for this. Postop with stitches intact wound edges well coapted and negative Homans sign    Assessment:     Doing well post endoscopic surgery right    Plan:     Reviewed continued physical therapy dispensed surgical shoe and Ace wrap and continue elevation and immobilization with boot. Reappoint 2 weeks for suture removal or earlier if needed

## 2016-04-02 ENCOUNTER — Encounter: Payer: Self-pay | Admitting: Podiatry

## 2016-04-02 ENCOUNTER — Ambulatory Visit (INDEPENDENT_AMBULATORY_CARE_PROVIDER_SITE_OTHER): Payer: 59 | Admitting: Podiatry

## 2016-04-02 DIAGNOSIS — M722 Plantar fascial fibromatosis: Secondary | ICD-10-CM | POA: Diagnosis not present

## 2016-04-02 DIAGNOSIS — Z9889 Other specified postprocedural states: Secondary | ICD-10-CM

## 2016-04-02 NOTE — Progress Notes (Signed)
Subjective:     Patient ID: Jesus Clark, male   DOB: May 04, 1961, 55 y.o.   MRN: [id number]  HPI patient states I'm doing great with my right heel   Review of Systems     Objective:   Physical Exam Neurovascular status intact negative Homans sign noted with patient's right heel doing will well with wound edges well coapted stitches intact    Assessment:     Doing well post endoscopic surgery right    Plan:     At this time went ahead and removed stitches dispensed anklet for compression and advised on elevation and continued boot usage at times and reappoint to recheck

## 2016-06-19 NOTE — Progress Notes (Signed)
DOS 06.20.2017 Endoscopic Release Med. Band Right Heel

## 2018-11-11 ENCOUNTER — Telehealth: Payer: Self-pay | Admitting: Nurse Practitioner

## 2018-11-11 ENCOUNTER — Other Ambulatory Visit: Payer: Self-pay | Admitting: Orthopedic Surgery

## 2018-11-11 DIAGNOSIS — M4326 Fusion of spine, lumbar region: Secondary | ICD-10-CM

## 2018-11-11 NOTE — Telephone Encounter (Signed)
Phone call to patient to verify medication list and allergies for myelogram procedure. Spoke with patients wife, she is aware pt will not need to hold any medications for this procedure.

## 2018-11-16 NOTE — Discharge Instructions (Signed)

## 2018-11-17 ENCOUNTER — Ambulatory Visit
Admission: RE | Admit: 2018-11-17 | Discharge: 2018-11-17 | Disposition: A | Discharge status: Home / Self Care | Payer: 59 | Source: Ambulatory Visit | Attending: Orthopedic Surgery | Admitting: Orthopedic Surgery

## 2018-11-17 DIAGNOSIS — M4326 Fusion of spine, lumbar region: Secondary | ICD-10-CM

## 2018-11-17 MED ORDER — HYDROCODONE-ACETAMINOPHEN 5-325 MG PO TABS: 2.0000 | ORAL_TABLET | Freq: Once | ORAL | Status: DC

## 2018-11-17 MED ORDER — DIAZEPAM 5 MG PO TABS: 10.0000 mg | ORAL_TABLET | Freq: Once | ORAL | Status: DC

## 2018-11-17 MED ORDER — ONDANSETRON HCL 4 MG/2ML IJ SOLN: 4.0000 mg | Freq: Four times a day (QID) | INTRAMUSCULAR | Status: DC | PRN

## 2018-11-17 MED ORDER — IOPAMIDOL (ISOVUE-M 200) INJECTION 41%
20.0000 mL | Freq: Once | INTRAMUSCULAR | Status: AC
  Administered 2018-11-17: 20 mL via INTRATHECAL

## 2019-05-24 ENCOUNTER — Other Ambulatory Visit: Payer: Self-pay | Admitting: Emergency Medicine

## 2019-05-24 DIAGNOSIS — Z20822 Contact with and (suspected) exposure to covid-19: Secondary | ICD-10-CM

## 2019-05-25 LAB — NOVEL CORONAVIRUS, NAA: SARS-CoV-2, NAA: NOT DETECTED

## 2019-05-31 IMAGING — XA DG MYELOGRAPHY LUMBAR INJ LUMBOSACRAL
10 series · 10 of 10 positions shown · non-contrast
Comparison: MRI 06/23/2018

CLINICAL DATA: Acute onset left low back and thigh pain after
getting up from a chair recently.
TECHNIQUE: Contiguous axial images were obtained through the Lumbar spine after
the intrathecal infusion of infusion. Coronal and sagittal
reconstructions were obtained of the axial image sets.

[Series 1: vasc adipose · 1 of 1 slices shown (1 of 7)]
[im 1/1]
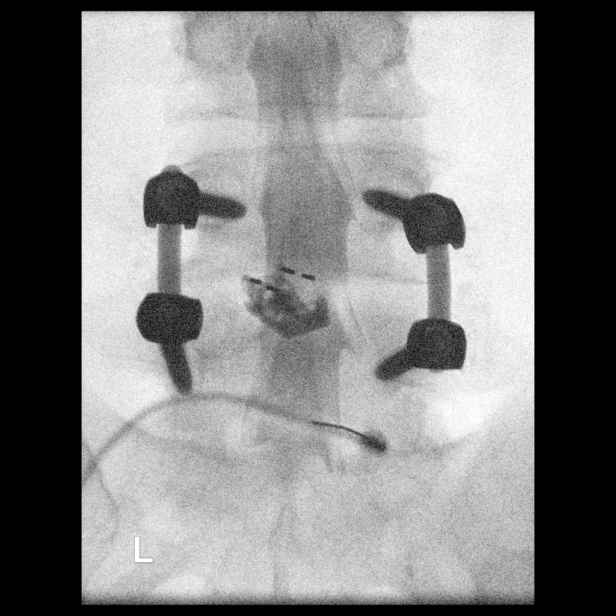

[Series 1: w lumbar spine lat · 0.15mm/px · 1 of 1 slices shown]
[im 1/1]
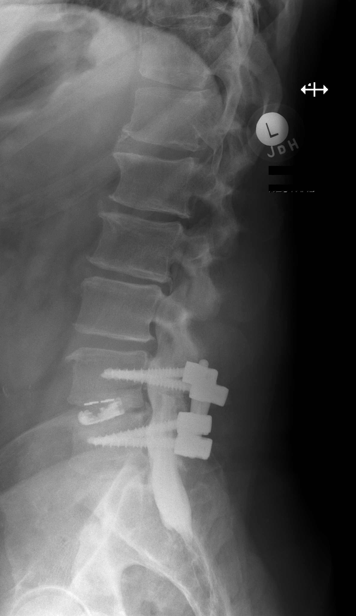

[Series 2: w lumbar spine flexion · 0.15mm/px · 1 of 1 slices shown]
[im 1/1]
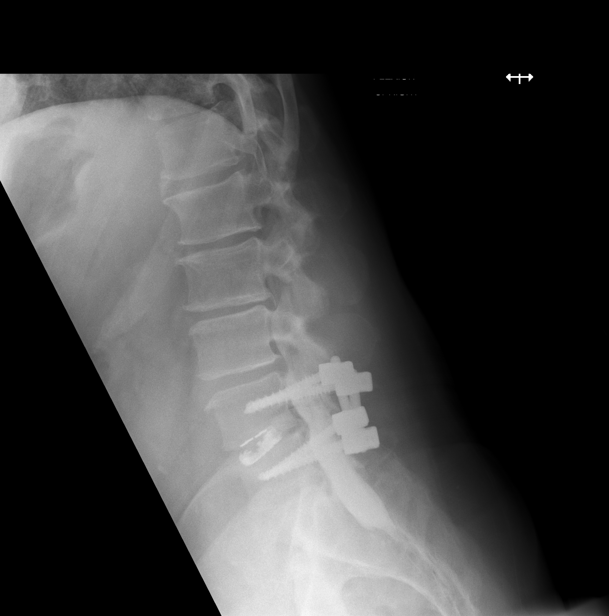

[Series 2: vasc adipose · 1 of 1 slices shown (2 of 7)]
[im 1/1]
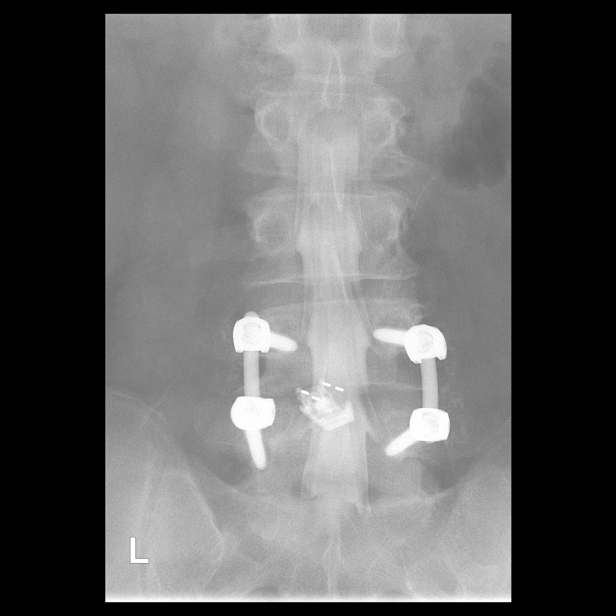

[Series 3: w lumbar spine extension · 0.15mm/px · 1 of 1 slices shown]
[im 1/1]
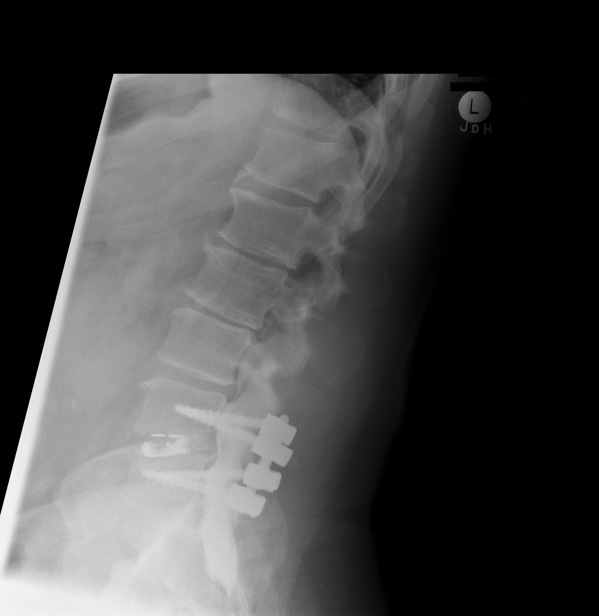

[Series 3: vasc adipose · 1 of 1 slices shown (3 of 7)]
[im 1/1]
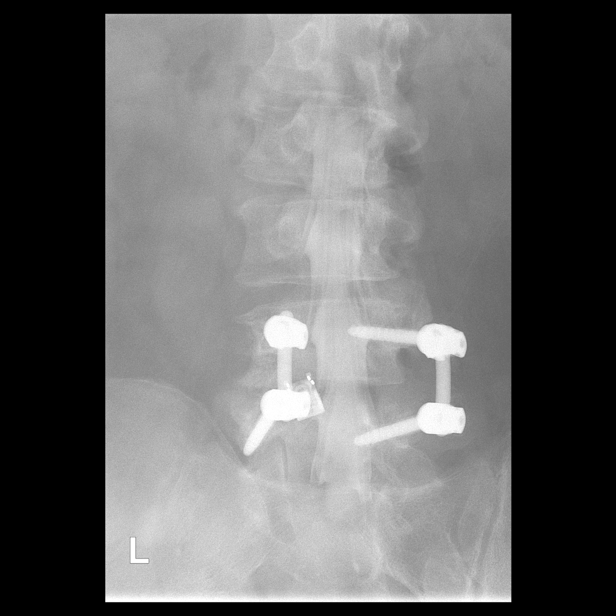

[Series 4: vasc adipose · 1 of 1 slices shown (4 of 7)]
[im 1/1]
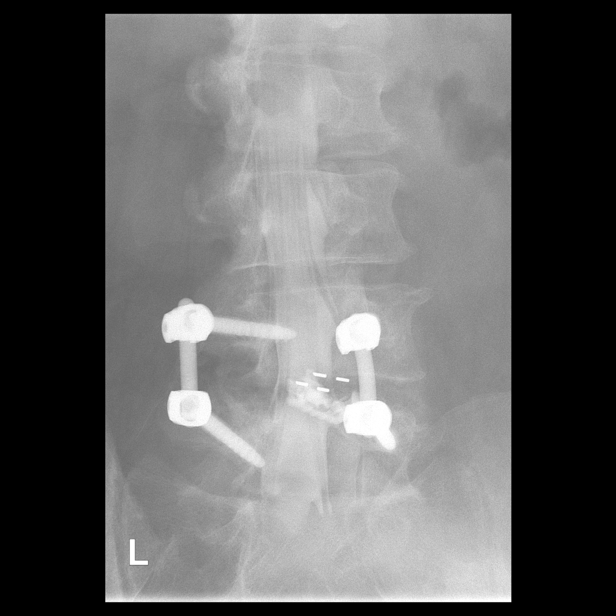

[Series 5: vasc adipose · 1 of 1 slices shown (5 of 7)]
[im 1/1]
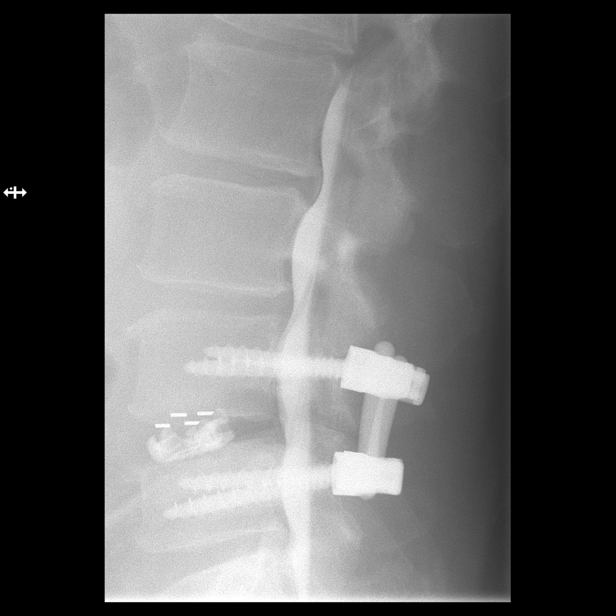

[Series 6: vasc adipose · 1 of 1 slices shown (6 of 7)]
[im 1/1]
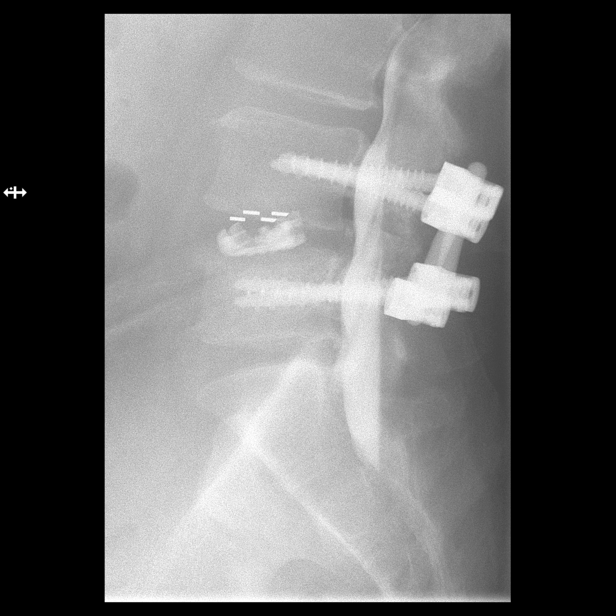

[Series 7: vasc adipose · 1 of 1 slices shown (7 of 7)]
[im 1/1]
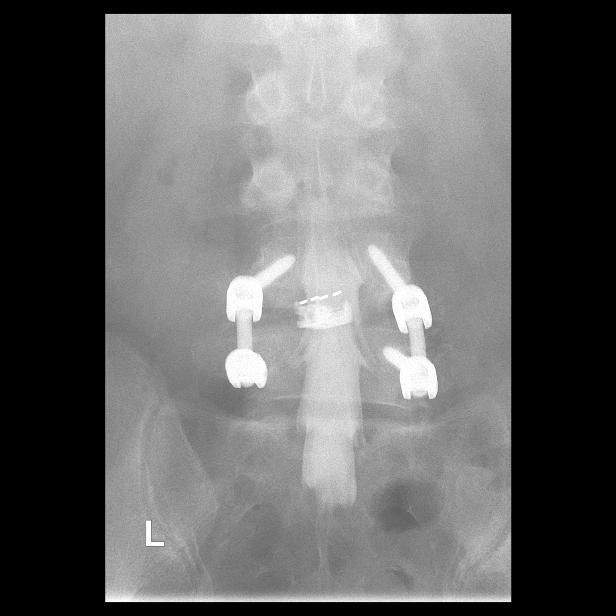

[10 of 10 positions shown; findings below may reference images not displayed]

EXAM:
LUMBAR MYELOGRAM

FLUOROSCOPY TIME:  0 minutes 30 seconds. 245.68 micro gray meter
squared

PROCEDURE:
After thorough discussion of risks and benefits of the procedure
including bleeding, infection, injury to nerves, blood vessels,
adjacent structures as well as headache and CSF leak, written and
oral informed consent was obtained. Consent was obtained by Dr. Adenyo
Suomalainen. Time out form was completed.

Patient was positioned prone on the fluoroscopy table. Local
anesthesia was provided with 1% lidocaine without epinephrine after
prepped and draped in the usual sterile fashion. Puncture was
performed at L5-S1 using a 3 1/2 inch 22-gauge spinal needle via
right paramedian approach. Using a single pass through the dura, the
needle was placed within the thecal sac, with return of clear CSF.
15 mL of Isovue C-YUU was injected into the thecal sac, with normal
opacification of the nerve roots and cauda equina consistent with
free flow within the subarachnoid space.

I personally performed the lumbar puncture and administered the
intrathecal contrast. I also personally performed acquisition of the
myelogram images.
FINDINGS: LUMBAR MYELOGRAM FINDINGS:

Small anterior extradural defects throughout the lumbar region but
no central canal stenosis. Previous discectomy, decompression and
fusion at L4-5. No evidence of abnormal root sleeve filling. No
evidence of hardware fracture or disconnection. Standing flexion
extension views show questionable mild motion at L4-5 with rocking.
Also the disc space appears more narrow with standing, suggesting
subsidence with possible nonunion.

CT LUMBAR MYELOGRAM FINDINGS:

T11-12: Normal.

T12-L1: Tiny central disc protrusion.  No stenosis.

L1-2: Annular bulging and calcification. No compressive stenosis.
Mild facet osteoarthritis.

L2-3: Annular bulging and calcification. Mild right foraminal
narrowing. No definite neural compression. Mild facet
osteoarthritis.

L3-4: Bulging of the disc. Right foraminal stenosis due to bulging
disc material and right more than left facet arthropathy. The right
L3 nerve could be affected as it exits the narrowed foramen. Lesser
narrowing of the intervertebral foramen on the left, which could
possibly affect the left L3 nerve, though the findings are not as
pronounced as on the right. Mild narrowing of the lateral recesses
without visible neural compression.

L4-5: Previous posterior decompression, diskectomy and fusion
procedure. Right L4 and L5 pedicle screws well positioned. Left L4
pedicle screw well-positioned. Left L5 pedicle screw slightly
lateral, breaching the lateral vertebral body cortex. Question mild
lucency around the screw. The interbody spacer shows adjacent
lucency with some subsidence. It is not clear that there is solid
union at this point in time. The intervertebral foramen on the left
appears to show encroachment by osteophyte and or disc material.
There could also be some fibrosis related to the discectomy
procedure. Some potential the left L4 nerve could be affected.

L5-S1: Annular bulging more towards the left calcification. No
likely compressive stenosis.

Solid bridging osteophytes of the right sacroiliac joint.
IMPRESSION: L2-3: Annular bulging and calcification. Foraminal narrowing right
more than left could possibly affect the right L2 nerve.

L3-4: Lateral recess narrowing right more than left because of
bulging disc material and facet arthropathy. Potential to affect
either L3 nerve, more likely the right based on the morphologic
picture.

L4-5: I do not see convincing evidence of solid union at this time.
There is some subsidence of the interbody spacer, with surrounding
lucency. Question mild lucency around the left L5 screw, which is
slightly lateral, breaching the lateral vertebral body cortex but
not encountering any significant structure. Abnormal appearance of
the intervertebral foramen on the left which could be due to a
combination of osteophyte, disc, facet arthropathy and fibrosis from
the surgical approach, which together could affect the left L4
nerve.

## 2023-03-11 ENCOUNTER — Other Ambulatory Visit: Payer: Self-pay

## 2023-03-11 DIAGNOSIS — M5412 Radiculopathy, cervical region: Secondary | ICD-10-CM

## 2023-03-11 DIAGNOSIS — M50123 Cervical disc disorder at C6-C7 level with radiculopathy: Secondary | ICD-10-CM

## 2023-03-11 DIAGNOSIS — M4802 Spinal stenosis, cervical region: Secondary | ICD-10-CM

## 2023-03-13 ENCOUNTER — Other Ambulatory Visit: Payer: Self-pay | Admitting: Internal Medicine

## 2023-03-13 DIAGNOSIS — M4802 Spinal stenosis, cervical region: Secondary | ICD-10-CM

## 2023-03-13 DIAGNOSIS — M5412 Radiculopathy, cervical region: Secondary | ICD-10-CM

## 2023-03-13 DIAGNOSIS — M50123 Cervical disc disorder at C6-C7 level with radiculopathy: Secondary | ICD-10-CM

## 2023-04-07 ENCOUNTER — Ambulatory Visit
Admission: RE | Admit: 2023-04-07 | Discharge: 2023-04-07 | Disposition: A | Discharge status: Home / Self Care | Payer: 59 | Source: Ambulatory Visit | Attending: Internal Medicine | Admitting: Internal Medicine

## 2023-04-07 DIAGNOSIS — M4802 Spinal stenosis, cervical region: Secondary | ICD-10-CM

## 2023-04-07 DIAGNOSIS — M5412 Radiculopathy, cervical region: Secondary | ICD-10-CM

## 2023-04-07 DIAGNOSIS — M50123 Cervical disc disorder at C6-C7 level with radiculopathy: Secondary | ICD-10-CM
